# Patient Record
Sex: Male | Born: 1995 | Hispanic: No | Marital: Single | Smoking: Never smoker
Health system: Southern US, Community
[De-identification: ages and names within clinical notes are randomized; demographics above are authoritative.]

---
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
## 2016-07-19 IMAGING — CT CT HEAD W/O CM
2 series · 16 of 30 positions shown, 18 images · non-contrast
Comparison: None.

CLINICAL DATA: Head injury while playing soccer. Struck occipital
region of head. Loss of consciousness.

EXAM:
CT HEAD WITHOUT CONTRAST
TECHNIQUE: Contiguous axial images were obtained from the base of the skull
through the vertex without intravenous contrast.

[Series 201: head w/o, idose (1) · axial · non-contrast · 0.43mm/px · z∈[+118,+228]mm · 8 of 30 slices shown, 10 images]
[im 4/30  brain]
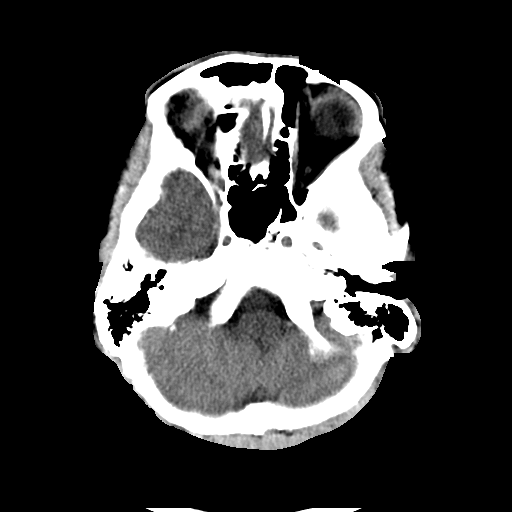
[im 4/30  bone]
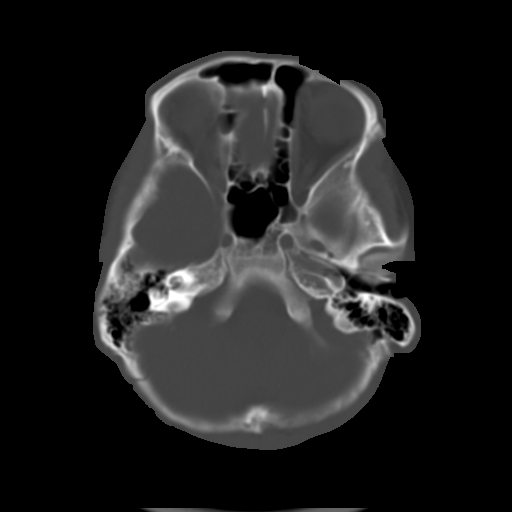
[im 7/30  brain]
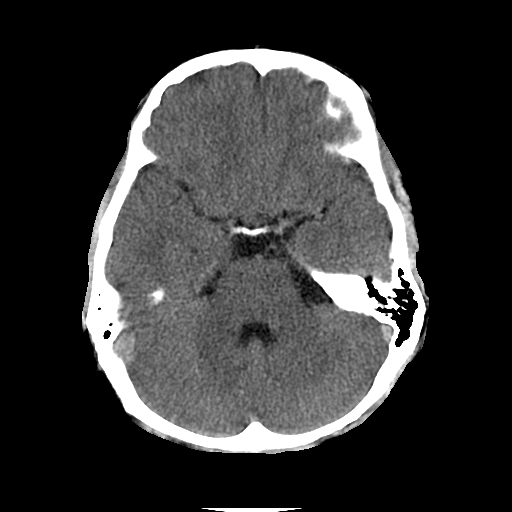
[im 10/30  brain]
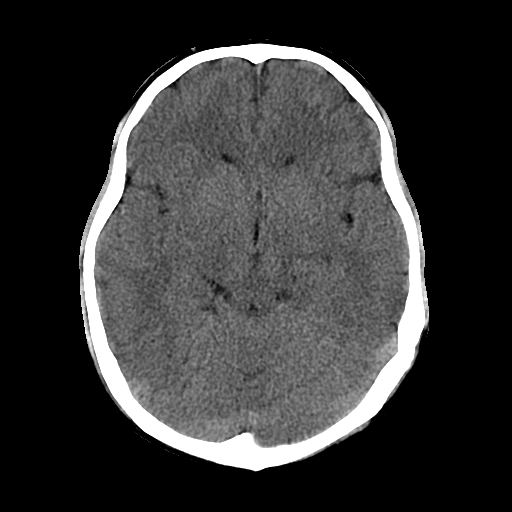
[im 13/30  brain]
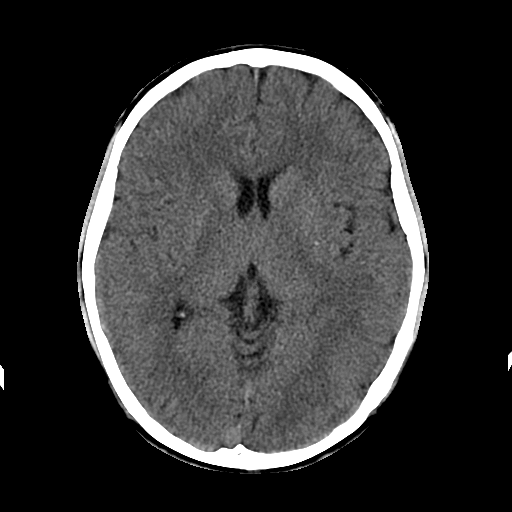
[im 17/30  brain]
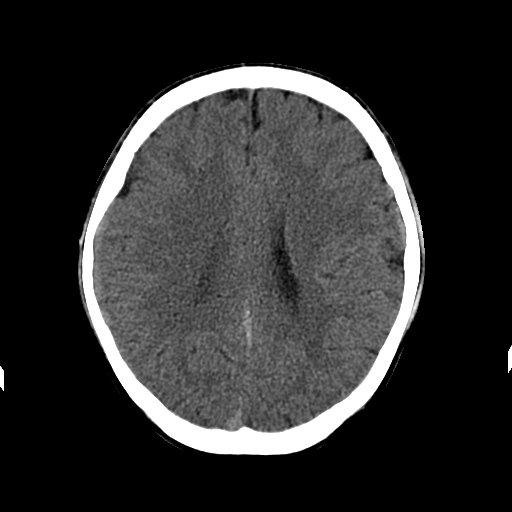
[im 17/30  bone]
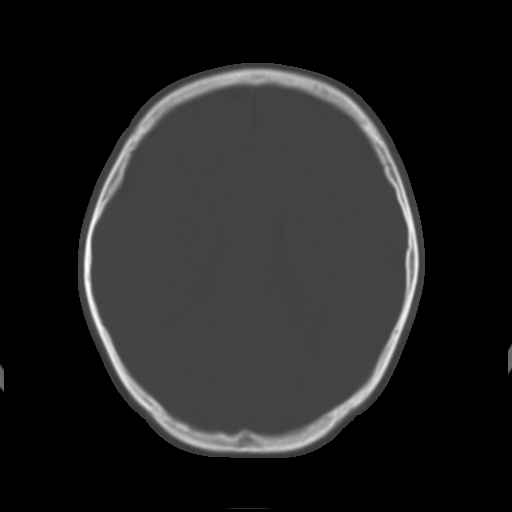
[im 20/30  brain]
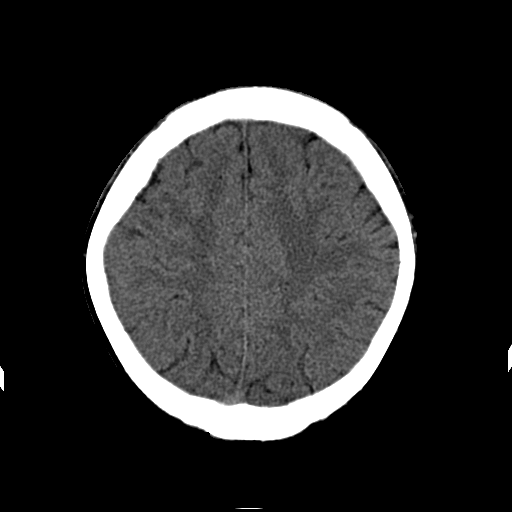
[im 23/30  brain]
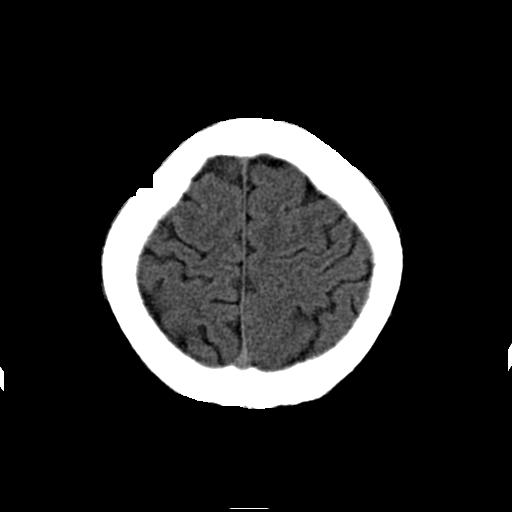
[im 26/30  brain]
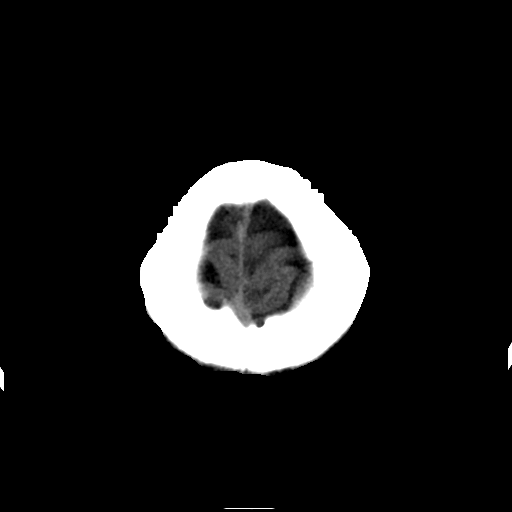

[Series 202: head w/o bone, idose (1) · axial · non-contrast · 0.43mm/px · z∈[+117,+232]mm · 8 of 60 slices shown]
[im 7/60  bone]
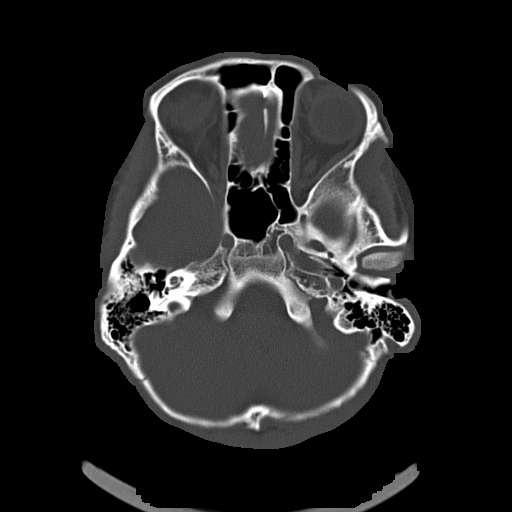
[im 13/60  bone]
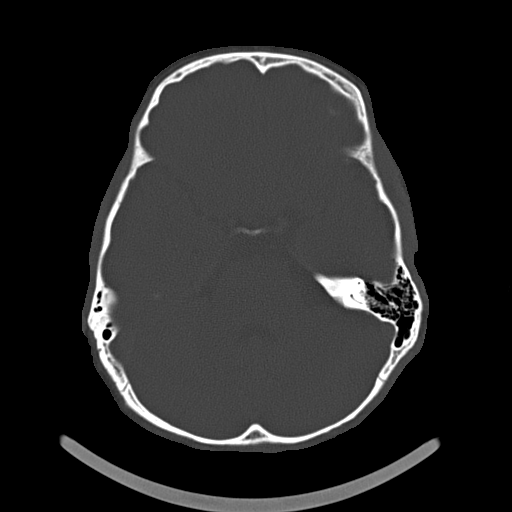
[im 19/60  bone]
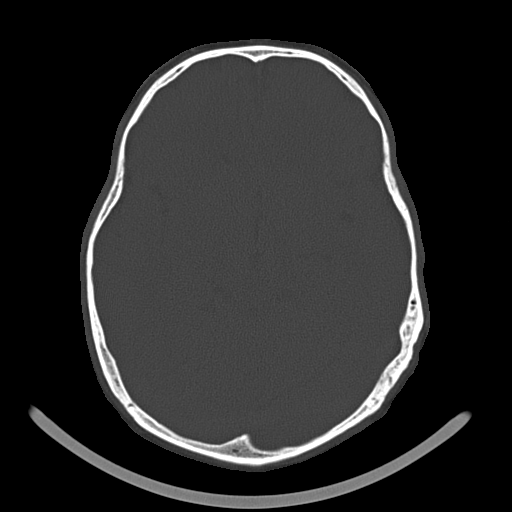
[im 25/60  bone]
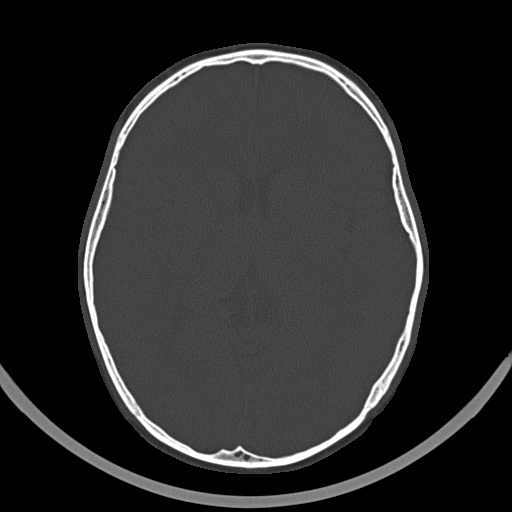
[im 35/60  bone]
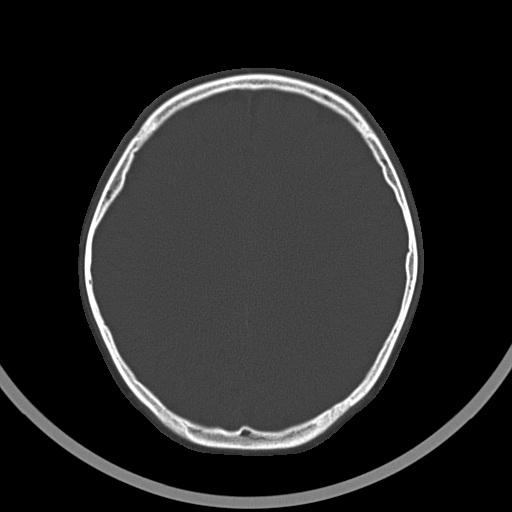
[im 41/60  bone]
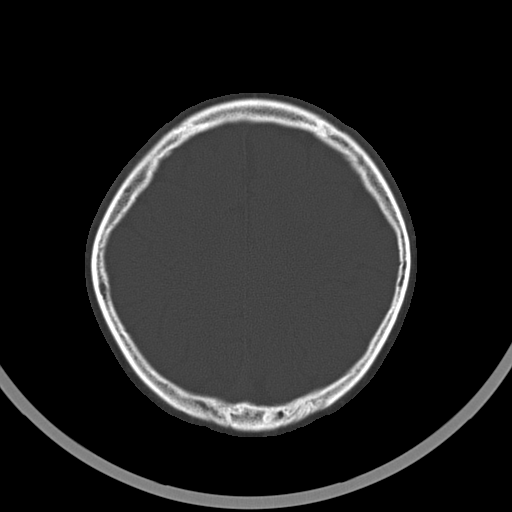
[im 47/60  bone]
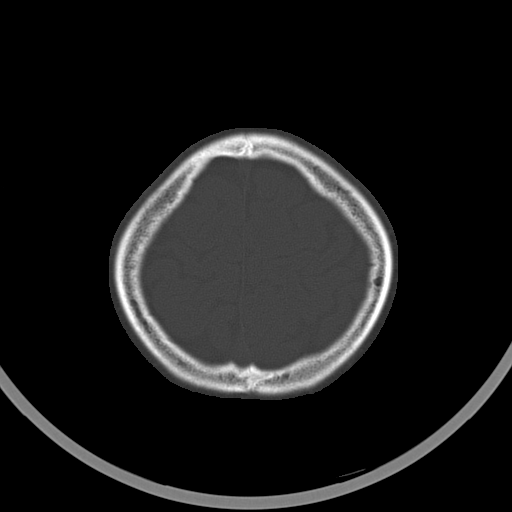
[im 53/60  bone]
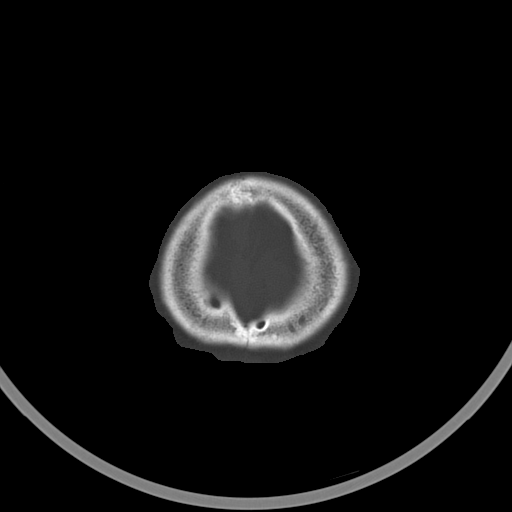

[16 of 30 positions shown; findings below may reference images not displayed]

FINDINGS: Ventricles and sulci appear symmetrical. No mass effect or midline
shift. No abnormal extra-axial fluid collections. Gray-white matter
junctions are distinct. Basal cisterns are not effaced. No evidence
of acute intracranial hemorrhage. No depressed skull fractures.
Visualized paranasal sinuses and mastoid air cells are not
opacified.
IMPRESSION: No acute intracranial abnormalities.

## 2020-11-04 DIAGNOSIS — Z23 Encounter for immunization: Secondary | ICD-10-CM | POA: Insufficient documentation

## 2020-11-04 DIAGNOSIS — Y9389 Activity, other specified: Secondary | ICD-10-CM | POA: Insufficient documentation

## 2020-11-04 DIAGNOSIS — W260XXA Contact with knife, initial encounter: Secondary | ICD-10-CM | POA: Insufficient documentation

## 2020-11-04 DIAGNOSIS — S61412A Laceration without foreign body of left hand, initial encounter: Secondary | ICD-10-CM | POA: Insufficient documentation

## 2020-11-04 MED ADMIN — Tet-Diph-Acell Pertuss Ad Pref Syr 5-2.5-18.5 LF-MCG/0.5ML: 0.5 mL | INTRAMUSCULAR | NDC 58160084252

## 2022-05-11 IMAGING — CR DG FINGER THUMB 2+V*L*
3 series · 3 of 3 positions shown · non-contrast
Comparison: None.

CLINICAL DATA: Laceration

EXAM:
LEFT THUMB 2+V

[finger obl]
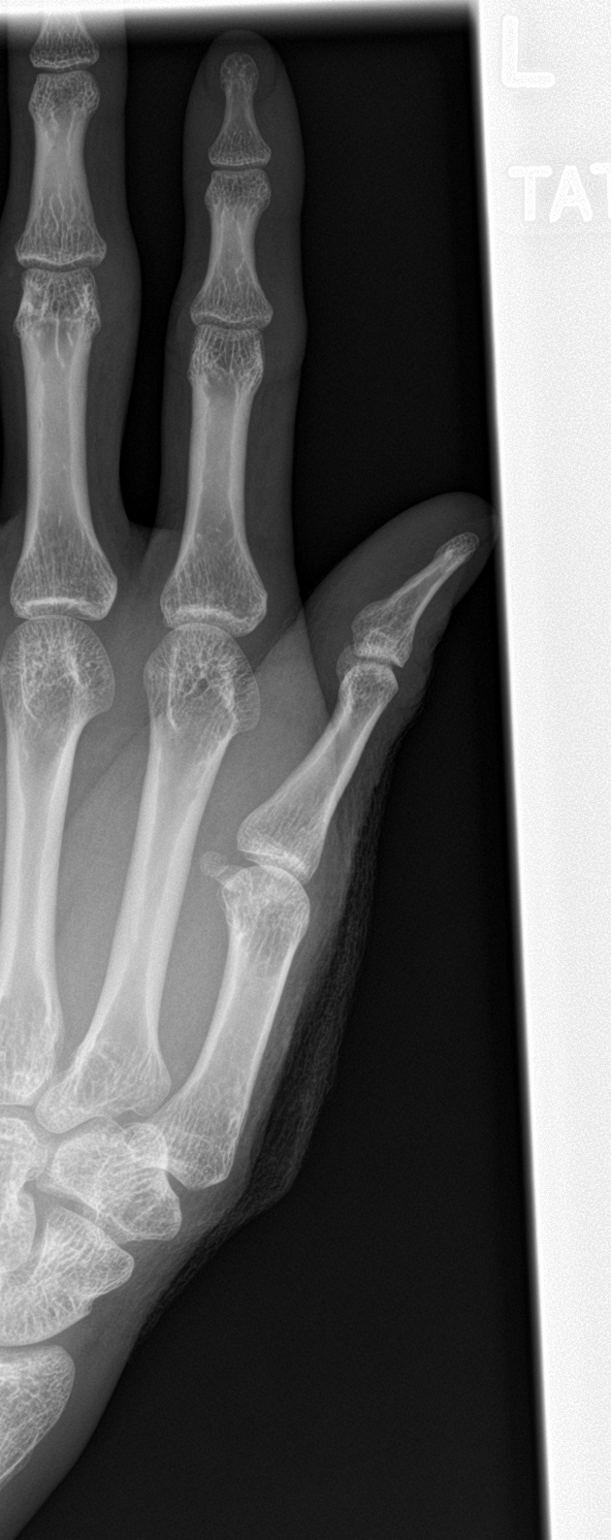

[finger lat]
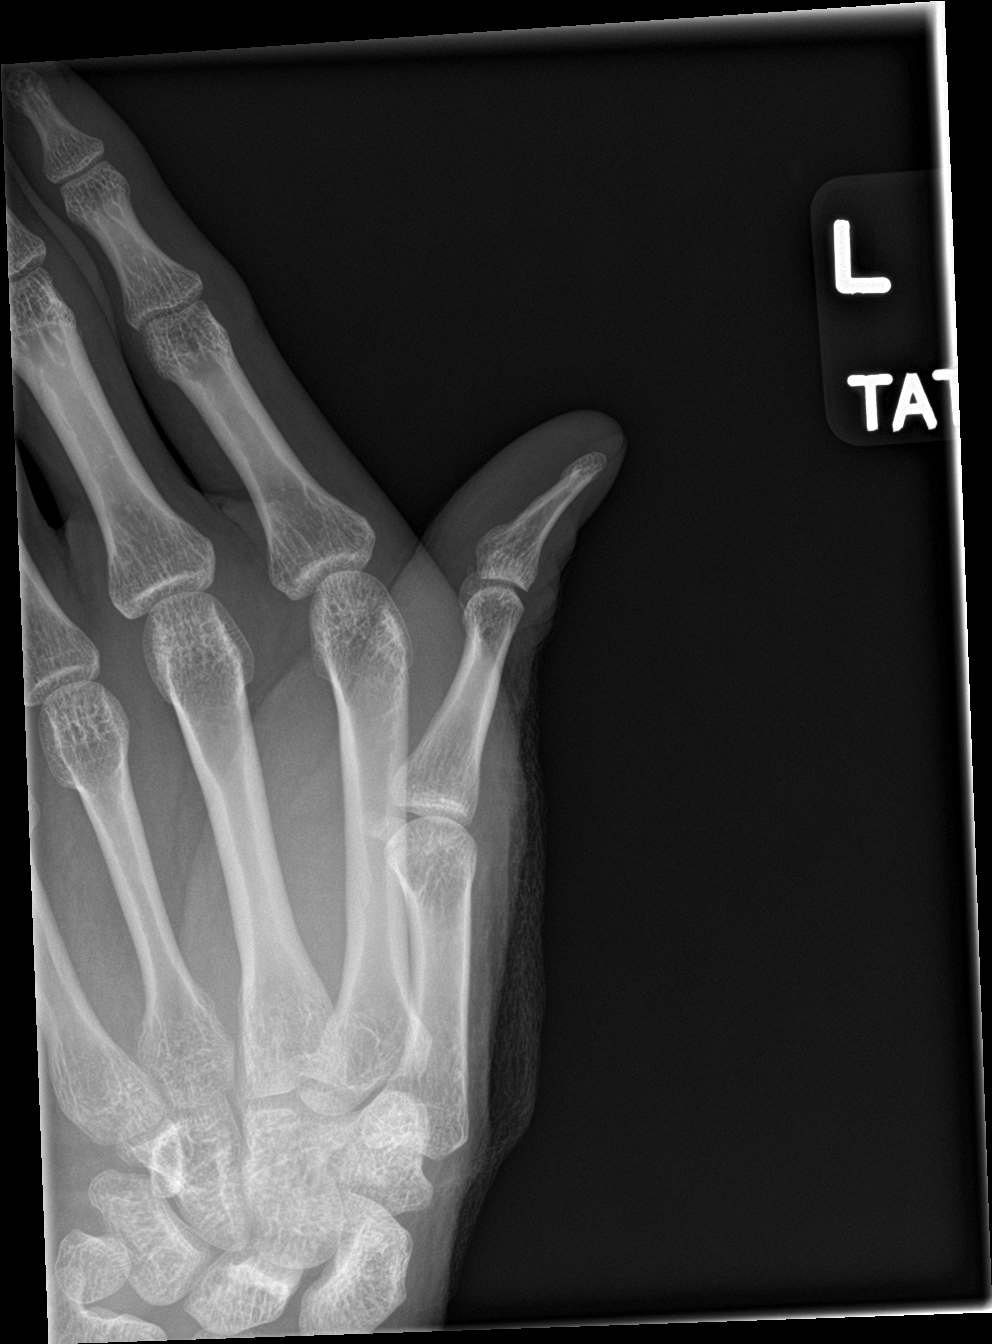

[finger ap]
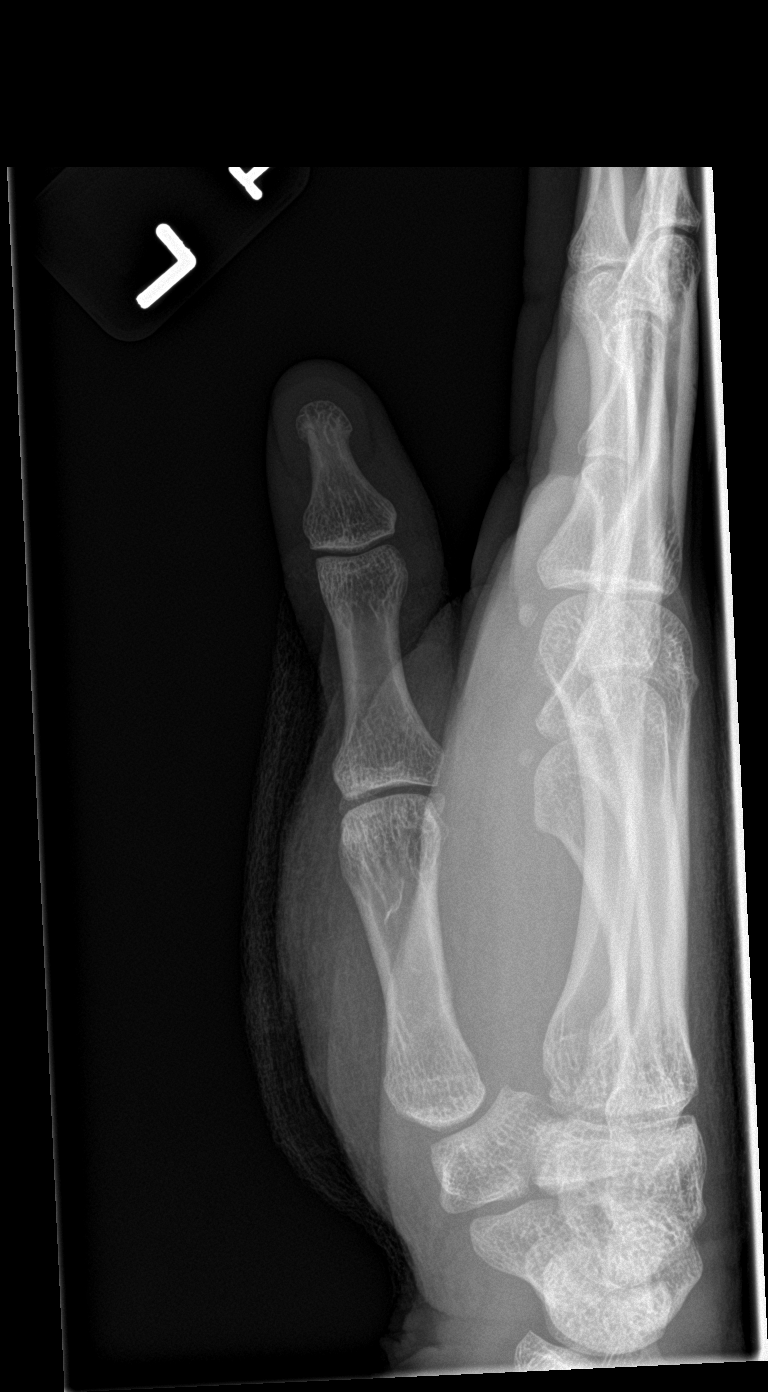

[3 of 3 positions shown; findings below may reference images not displayed]

FINDINGS: Alignment is anatomic. No acute fracture. Joint spaces are
preserved. There is no radiopaque foreign body.
IMPRESSION: No acute osseous abnormality.  No radiopaque foreign body.

## 2023-11-30 ENCOUNTER — Ambulatory Visit
Admission: EM | Admit: 2023-11-30 | Discharge: 2023-11-30 | Disposition: A | Discharge status: Home / Self Care | Payer: Self-pay | Attending: Family Medicine | Admitting: Family Medicine

## 2023-11-30 ENCOUNTER — Encounter: Payer: Self-pay | Admitting: Emergency Medicine

## 2023-11-30 DIAGNOSIS — L249 Irritant contact dermatitis, unspecified cause: Secondary | ICD-10-CM
